# Patient Record
Sex: Female | Born: 2000 | Race: Black or African American | Hispanic: No | Marital: Single | Smoking: Never smoker
Health system: Southern US, Community
[De-identification: ages and names within clinical notes are randomized; demographics above are authoritative.]

## PROBLEM LIST (undated history)

## (undated) HISTORY — PX: HIP SURGERY: SHX245

---
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
## 2021-03-31 ENCOUNTER — Emergency Department (HOSPITAL_BASED_OUTPATIENT_CLINIC_OR_DEPARTMENT_OTHER)
Admission: EM | Admit: 2021-03-31 | Discharge: 2021-03-31 | Disposition: A | Discharge status: Home / Self Care | Payer: BLUE CROSS/BLUE SHIELD | Attending: Emergency Medicine | Admitting: Emergency Medicine

## 2021-03-31 ENCOUNTER — Other Ambulatory Visit: Payer: Self-pay

## 2021-03-31 ENCOUNTER — Encounter (HOSPITAL_BASED_OUTPATIENT_CLINIC_OR_DEPARTMENT_OTHER): Payer: Self-pay | Admitting: *Deleted

## 2021-03-31 DIAGNOSIS — R350 Frequency of micturition: Secondary | ICD-10-CM | POA: Insufficient documentation

## 2021-03-31 DIAGNOSIS — Z113 Encounter for screening for infections with a predominantly sexual mode of transmission: Secondary | ICD-10-CM

## 2021-03-31 LAB — RPR: RPR Ser Ql: NONREACTIVE

## 2021-03-31 LAB — URINALYSIS, ROUTINE W REFLEX MICROSCOPIC
Bilirubin Urine: NEGATIVE
Glucose, UA: NEGATIVE mg/dL
Hgb urine dipstick: NEGATIVE
Ketones, ur: NEGATIVE mg/dL
Leukocytes,Ua: NEGATIVE
Nitrite: NEGATIVE
Protein, ur: NEGATIVE mg/dL
Specific Gravity, Urine: 1.015 (ref 1.005–1.030)
pH: 8 (ref 5.0–8.0)

## 2021-03-31 LAB — PREGNANCY, URINE: Preg Test, Ur: NEGATIVE

## 2021-03-31 LAB — WET PREP, GENITAL
Clue Cells Wet Prep HPF POC: NONE SEEN
Sperm: NONE SEEN
Trich, Wet Prep: NONE SEEN
Yeast Wet Prep HPF POC: NONE SEEN

## 2021-03-31 LAB — HIV ANTIBODY (ROUTINE TESTING W REFLEX): HIV Screen 4th Generation wRfx: NONREACTIVE

## 2021-03-31 NOTE — ED Triage Notes (Signed)
Burning sensation during urination this morning.  She also wants to be checked for STD.  No vaginal discharges.

## 2021-03-31 NOTE — Discharge Instructions (Addendum)
You have been screened for potential sexually transmitted infection.  Please check result through MyChart, link below.  If test positive, you will be notified with appropriate treatment. 

## 2021-03-31 NOTE — ED Provider Notes (Signed)
[facility] EMERGENCY DEPARTMENT Provider Note   CSN: [id number] Arrival date & time: 03/31/21  6712     History Chief Complaint  Patient presents with   Exposure to STD    Chelsey Reed is a 20 y.o. female.  The history is provided by the patient. No language interpreter was used.  Exposure to STD   20 year old female presenting requesting for STD check.  Patient reports she developed urinary discomfort which includes urinary frequency with burning urination which started this morning.  She recently came off her menstruation yesterday.  She is sexually active with 1 partner but not using protection.  Partner does not have any symptoms however she would like to be screened for STI.  She denies vaginal discharge.  No prior pregnancy.  No abdominal pain no fever or back pain or rash.  Remote history of chlamydia infection  History reviewed. No pertinent past medical history.  There are no problems to display for this patient.   Past Surgical History:  Procedure Laterality Date   HIP SURGERY       OB History   No obstetric history on file.     History reviewed. No pertinent family history.  Social History   Tobacco Use   Smoking status: Never    Passive exposure: Yes   Smokeless tobacco: Never  Vaping Use   Vaping Use: Never used  Substance Use Topics   Alcohol use: No   Drug use: Yes    Types: Marijuana    Comment: yesterday    Home Medications Prior to Admission medications   Not on File    Allergies    Patient has no known allergies.  Review of Systems   Review of Systems  All other systems reviewed and are negative.  Physical Exam Updated Vital Signs BP 118/67 (BP Location: Right Arm)   Pulse (!) 57   Temp 98.3 F (36.8 C) (Oral)   Resp 16   Ht 5\' 7"  (1.702 m)   Wt 93 kg   LMP 03/27/2021   SpO2 100%   BMI 32.11 kg/m   Physical Exam Vitals and nursing note reviewed.  Constitutional:      General: She is not in acute  distress.    Appearance: She is well-developed.  HENT:     Head: Atraumatic.  Eyes:     Conjunctiva/sclera: Conjunctivae normal.  Pulmonary:     Effort: Pulmonary effort is normal.  Abdominal:     Palpations: Abdomen is soft.     Tenderness: There is no abdominal tenderness.  Musculoskeletal:     Cervical back: Neck supple.  Skin:    Findings: No rash.  Neurological:     Mental Status: She is alert.  Psychiatric:        Mood and Affect: Mood normal.    ED Results / Procedures / Treatments   Labs (all labs ordered are listed, but only abnormal results are displayed) Labs Reviewed  WET PREP, GENITAL - Abnormal; Notable for the following components:      Result Value   WBC, Wet Prep HPF POC FEW (*)    All other components within normal limits  URINALYSIS, ROUTINE W REFLEX MICROSCOPIC  PREGNANCY, URINE  RPR  HIV ANTIBODY (ROUTINE TESTING W REFLEX)  GC/CHLAMYDIA PROBE AMP ([place]) NOT AT [facility]    EKG None  Radiology No results found.  Procedures Pelvic exam  Date/Time: 03/31/2021 10:39 AM Performed by: 04/02/2021, PA-C Authorized by: Fayrene Helper, PA-C  Consent:  Verbal consent obtained. Risks and benefits: risks, benefits and alternatives were discussed Consent given by: patient Patient understanding: patient states understanding of the procedure being performed Patient consent: the patient's understanding of the procedure matches consent given Patient identity confirmed: verbally with patient and arm band Comments: Pelvic exam performed with permission of pt and female ED RN assist during exam.  External genitalia w/out lesions.  Vaginal vault with normal functional discharge.  Cervix w/out lesions, not friable, GC/Chlamydia and wet prep obtained and sent to lab.  Bimanual exam w/out CMT, uterine or adnexal tenderness      Medications Ordered in ED Medications - No data to display  ED Course  I have reviewed the triage vital signs and the nursing  notes.  Pertinent labs & imaging results that were available during my care of the patient were reviewed by me and considered in my medical decision making (see chart for details).    MDM Rules/Calculators/A&P                          BP 121/71 (BP Location: Right Arm)   Pulse (!) 54   Temp 98.1 F (36.7 C) (Oral)   Resp 16   Ht 5\' 7"  (1.702 m)   Wt 93 kg   LMP 03/27/2021   SpO2 100%   BMI 32.11 kg/m   Final Clinical Impression(s) / ED Diagnoses Final diagnoses:  Screen for STD (sexually transmitted disease)    Rx / DC Orders ED Discharge Orders     None      10:08 AM Patient here with dysuria as well as requesting for STI check.  Pelvic exam unremarkable, labs are reassuring, patient made aware to follow-up on the remainder of her STI results through MyChart.  Otherwise she is stable for discharge.  Doubt PID.  No evidence of urinary tract infection.   03/29/2021, PA-C 03/31/21 1208    04/02/21, MD 03/31/21 1430

## 2021-04-01 LAB — GC/CHLAMYDIA PROBE AMP (~~LOC~~) NOT AT ARMC
Chlamydia: NEGATIVE
Comment: NEGATIVE
Comment: NORMAL
Neisseria Gonorrhea: NEGATIVE
